# Patient Record
Sex: Male | Born: 1965 | Race: White | Hispanic: No | Marital: Single | Smoking: Never smoker
Health system: Southern US, Community
[De-identification: ages and names within clinical notes are randomized; demographics above are authoritative.]

## PROBLEM LIST (undated history)

## (undated) DIAGNOSIS — E785 Hyperlipidemia, unspecified: Secondary | ICD-10-CM

## (undated) DIAGNOSIS — I10 Essential (primary) hypertension: Secondary | ICD-10-CM

## (undated) HISTORY — DX: Essential (primary) hypertension: I10

## (undated) HISTORY — DX: Hyperlipidemia, unspecified: E78.5

---
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
## 2009-09-04 ENCOUNTER — Encounter: Admission: RE | Admit: 2009-09-04 | Discharge: 2009-09-04 | Payer: Self-pay | Admitting: Internal Medicine

## 2011-09-26 ENCOUNTER — Other Ambulatory Visit: Payer: Self-pay | Admitting: Internal Medicine

## 2011-09-29 ENCOUNTER — Ambulatory Visit
Admission: RE | Admit: 2011-09-29 | Discharge: 2011-09-29 | Disposition: A | Discharge status: Home / Self Care | Payer: PRIVATE HEALTH INSURANCE | Source: Ambulatory Visit | Attending: Internal Medicine | Admitting: Internal Medicine

## 2012-01-22 IMAGING — CT CT HEAD W/O CM
2 series · 15 of 30 positions shown, 19 images · non-contrast
Comparison: Report from previous MRI examination performed in
[HOSPITAL] 03/03/2003

CLINICAL DATA: History of pineal cyst.

CT HEAD WITHOUT CONTRAST
TECHNIQUE: Contiguous axial images were obtained from the base of
the skull through the vertex without contrast.

[Series 3: head bone · axial · 0.49mm/px · z∈[+23,+45]mm · 2 of 28 slices shown]
[im 2/28  bone]
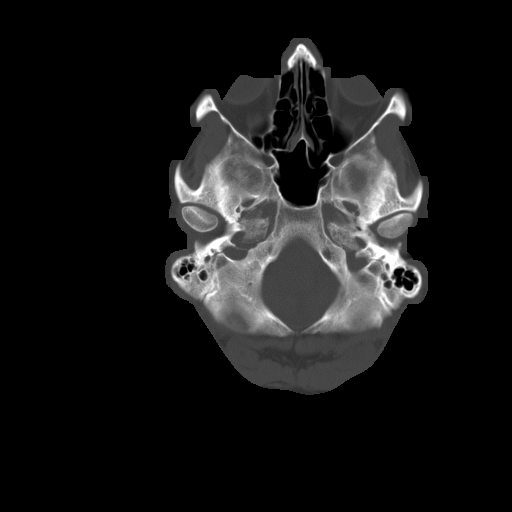
[im 6/28  bone]
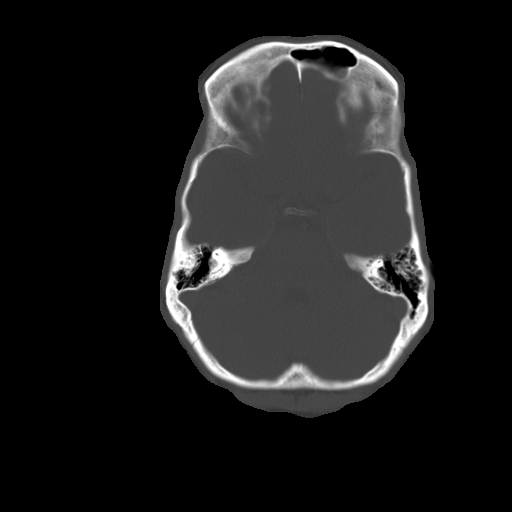

[Series 32: 3d filtered head w/o · axial · non-contrast · 0.49mm/px · z∈[+23,+154]mm · 13 of 28 slices shown, 17 images]
[im 2/28  brain]
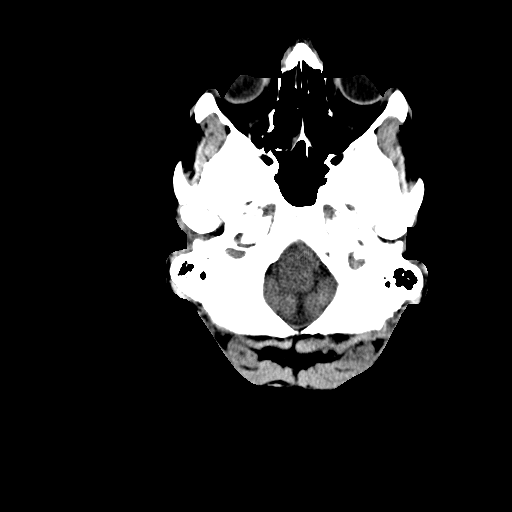
[im 2/28  bone]
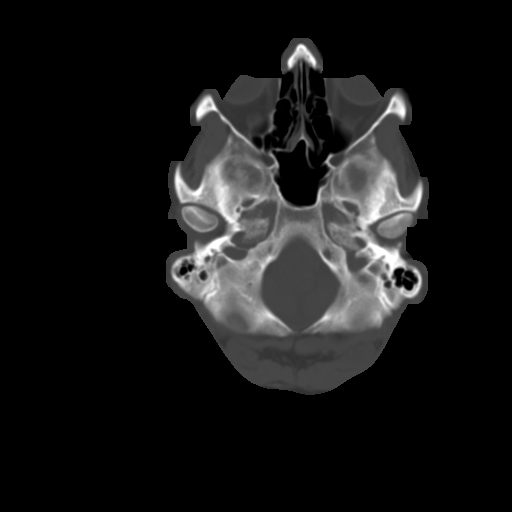
[im 4/28  brain]
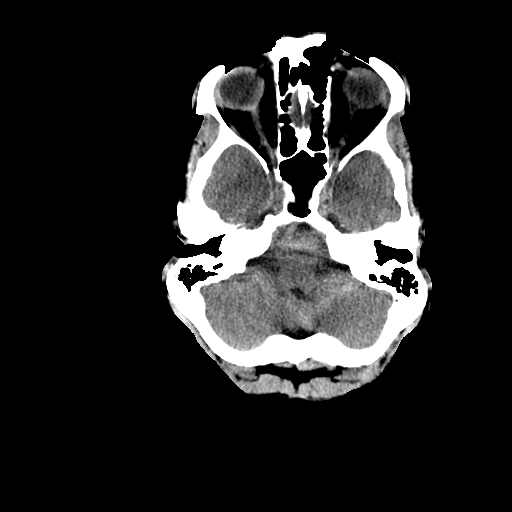
[im 6/28  brain]
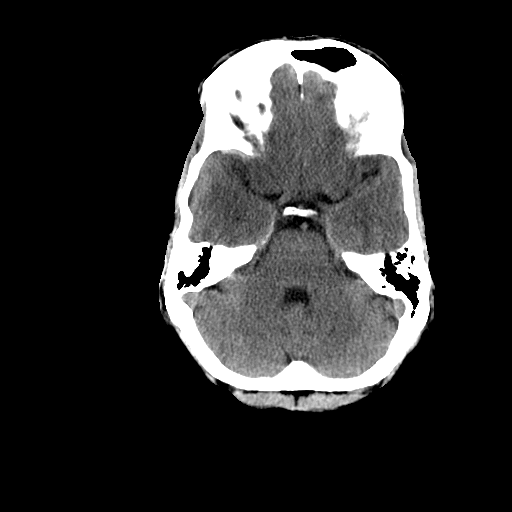
[im 8/28  brain]
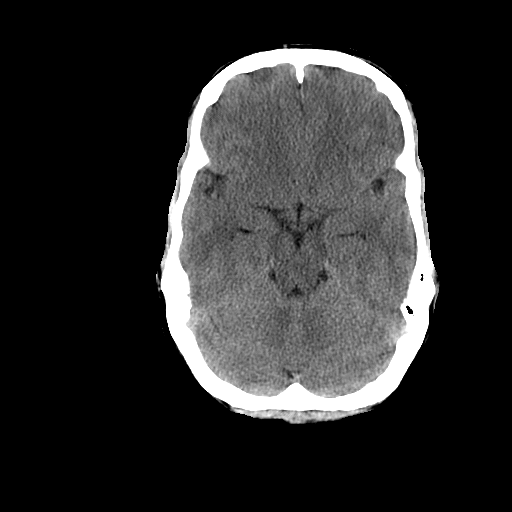
[im 10/28  brain]
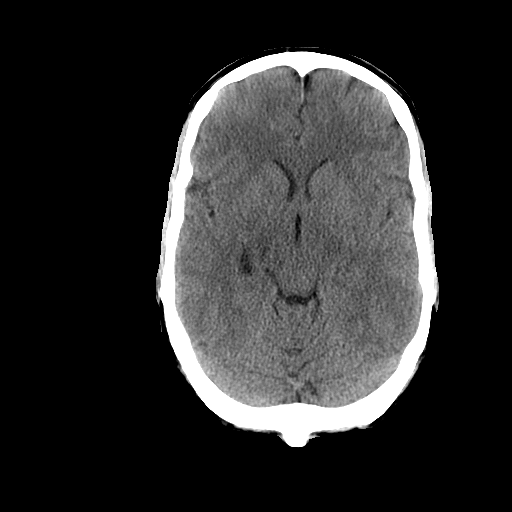
[im 10/28  bone]
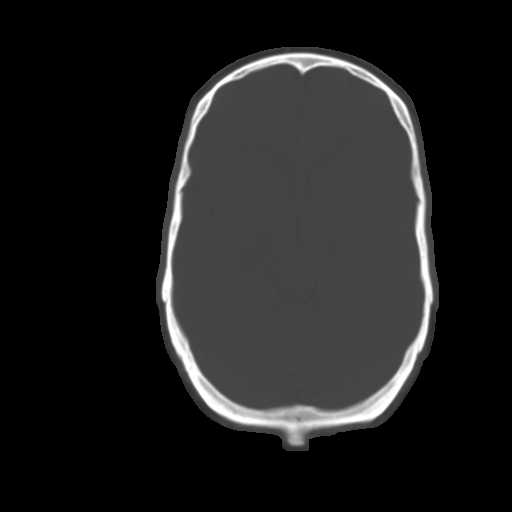
[im 12/28  brain]
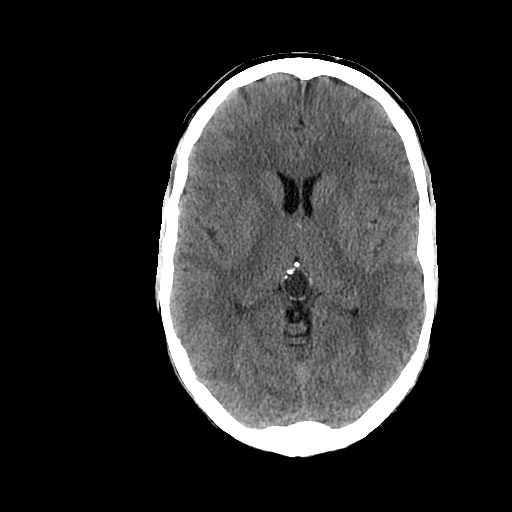
[im 14/28  brain]
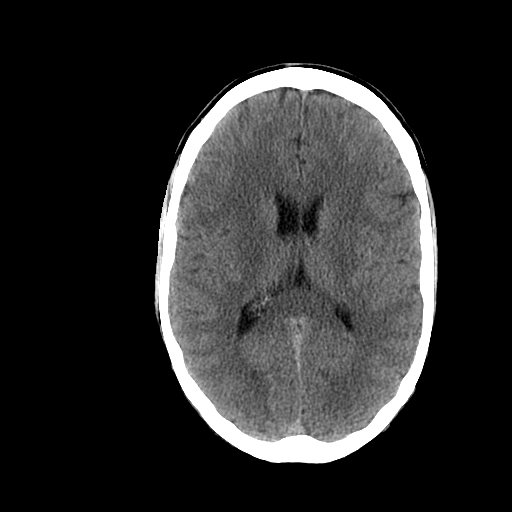
[im 16/28  brain]
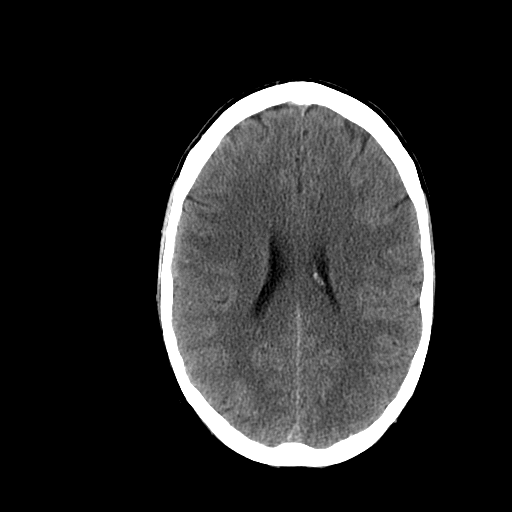
[im 18/28  brain]
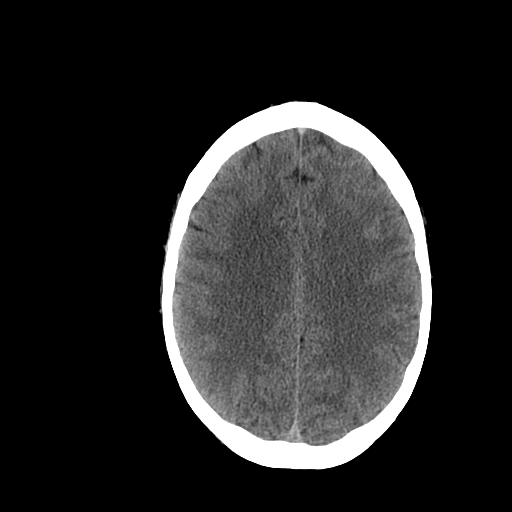
[im 18/28  bone]
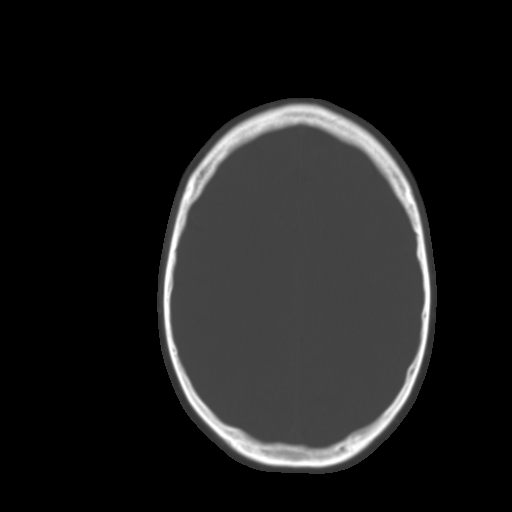
[im 20/28  brain]
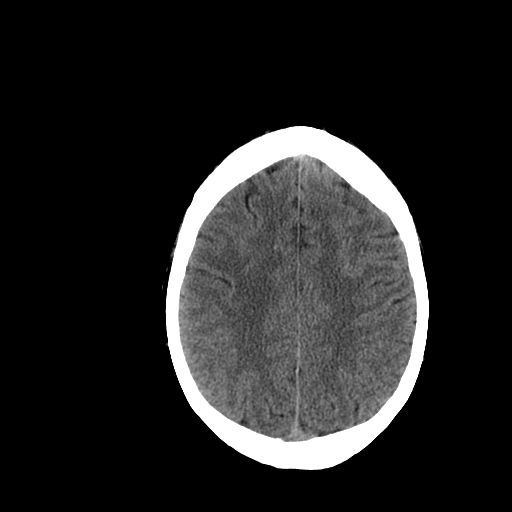
[im 22/28  brain]
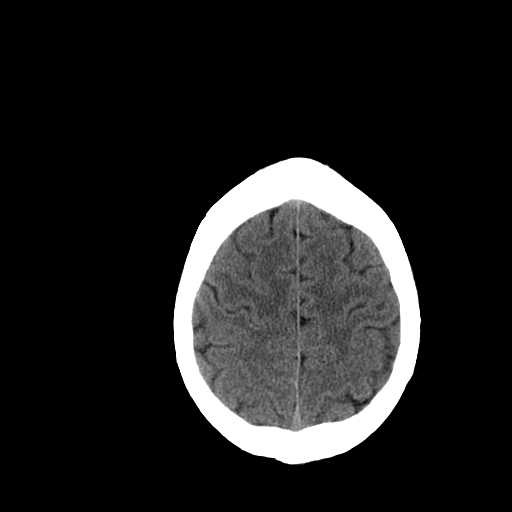
[im 24/28  brain]
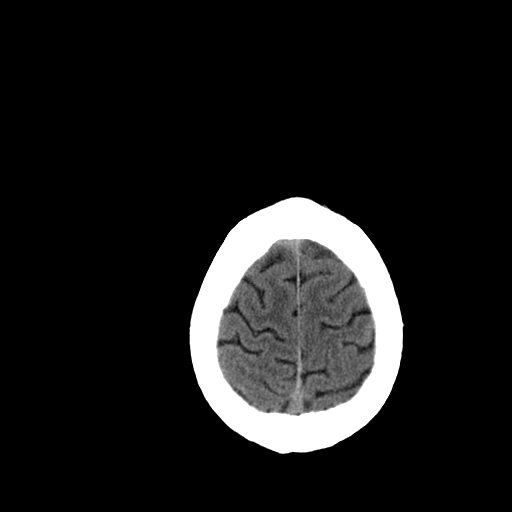
[im 26/28  brain]
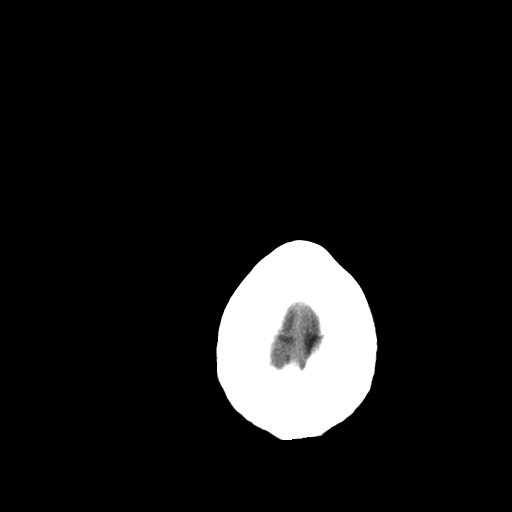
[im 26/28  bone]
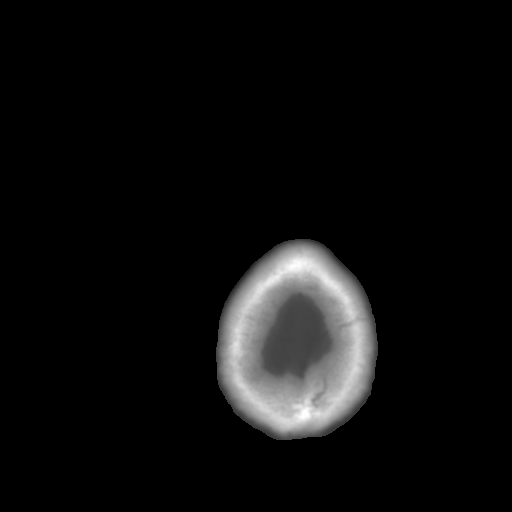

[15 of 30 positions shown; findings below may reference images not displayed]

FINDINGS: Previous report describes described a 1.4 x 0.7 x 1.2 cm
cyst in the region of the pineal gland and a right chorioid fissure
cyst measuring 1 x 1.7 x 0.6 cm.

There is a cystic lesion in the region of the pineal gland
measuring 16 mm front to back and 14 mm right to left.  There is
some peripheral calcification.  Cannot accurately measure
cephalocaudal dimension.  Because of the possibility of slight
enlargement, MRI with contrast would be suggested.  Low grade
pineal neoplasm is not excluded at this point.

Choroid fissure cyst on the right is noted as described previously.
This is not worrisome.

The remainder the brain has a normal appearance.  No evidence of
old or acute infarction, intra-axial mass lesion, hemorrhage,
hydrocephalus or extra-axial collection.  Sinuses, middle ears and
mastoids are clear.  No skull or skull base abnormality is seen.
IMPRESSION: Cystic lesion of the pineal region measuring 16 x 14 mm in the
axial plane, with some peripheral calcification.  The differential
diagnosis is pineal cyst versus cystic pineal neoplasm.  Because of
the potential for slight enlargement when compared to the dictated
measurements of the previous exam, MRI with contrast would be
suggested for better characterization. Since it has been over 6
years since the previous study, it is unlikely that this will be
significant, but MRI is the standard method to characterize these
lesions.

## 2016-05-30 DIAGNOSIS — B078 Other viral warts: Secondary | ICD-10-CM | POA: Diagnosis not present

## 2016-05-30 DIAGNOSIS — L570 Actinic keratosis: Secondary | ICD-10-CM | POA: Diagnosis not present

## 2016-05-30 DIAGNOSIS — D0461 Carcinoma in situ of skin of right upper limb, including shoulder: Secondary | ICD-10-CM | POA: Diagnosis not present

## 2016-05-30 DIAGNOSIS — D225 Melanocytic nevi of trunk: Secondary | ICD-10-CM | POA: Diagnosis not present

## 2016-05-30 DIAGNOSIS — X32XXXA Exposure to sunlight, initial encounter: Secondary | ICD-10-CM | POA: Diagnosis not present

## 2016-06-24 ENCOUNTER — Encounter (INDEPENDENT_AMBULATORY_CARE_PROVIDER_SITE_OTHER): Payer: Self-pay

## 2016-06-24 ENCOUNTER — Ambulatory Visit (INDEPENDENT_AMBULATORY_CARE_PROVIDER_SITE_OTHER): Payer: BLUE CROSS/BLUE SHIELD | Admitting: Sports Medicine

## 2016-06-24 ENCOUNTER — Encounter: Payer: Self-pay | Admitting: Sports Medicine

## 2016-06-24 DIAGNOSIS — S86119A Strain of other muscle(s) and tendon(s) of posterior muscle group at lower leg level, unspecified leg, initial encounter: Secondary | ICD-10-CM

## 2016-06-24 NOTE — Progress Notes (Signed)
    Subjective:  Joseph [name] is a 51 y.o. male who presents to the [facility] today with a chief complaint of leg cramps.   Referred for evaluation courtesy of Dr. Merri BrunetteWalter Pharr  HPI:  Leg pain Patient with episodic calf pain bilaterally (twice in left calf, once in right calf). First episode was about three months ago in his left calf that occurred about 10-15 mins into his usual run. The pain felt like a "tightness" that became so severe that he had to stop his run. This pain persisted for about 2 weeks after gradually subsiding. He had a similar episode of pain in his right calf a few weeks later about 10-15 minutes into his run that resolved after about 2 weeks, and again the same thing in his left calf about 2 months ago. Patient has not tried to run over the past 2 months and does not currently have any pain. Pain is not positional in nature. He did not notice anything that made the pain better or worse when it happened.   He has been a runner since high school Active in sports with no significant prior calf injuries  ROS: Denies low back pain Denies any circulatory issues Denies sciatica No swelling or discoloration in the calf  Objective:  Physical Exam: BP 120/70   Ht 5\' 10"  (1.778 m)   Wt 155 lb (70.3 kg)   BMI 22.24 kg/m   Gen: NAD, resting comfortably LLE: No deformities. Nontender to palpation throughout calf. Strength 5/5 in all fields. No pain with plantar or dorsiflexion. Mild loss of longitudinal arch. No leg length discrepancies.  RLE: No deformities. Nontender to palpation throughout calf. Strength 5/5 in all fields. No pain with plantar or dorsiflexion. Mild loss of longitudinal arch. No leg length discrepancies.   Running gait shows a supinated foot strike. He straps at the mid foot. There is mild rapid pronation at the mid foot.  Overall running form is good.  Assessment/Plan:  Gastrocnemius strain Symptoms consistent with recurrent gastrocnemius strain. Placed  patient in green insoles with heel lift today. Will treat conservatively with home exercise program including toe walk, reverse walk, heel walk and heel raises with straight leg and bent leg. Gave graduated program to return to running with increasing run-walk cycles. Return precautions reviewed. Follow up if not improving or worsening in 8 weeks.   Katina Degreealeb M. Jimmey RalphParker, MD [facility] Resident PGY-3 06/24/2016 3:27 PM

## 2016-06-24 NOTE — Assessment & Plan Note (Addendum)
Symptoms consistent with recurrent gastrocnemius strain. Placed patient in green insoles with heel lift today. Will treat conservatively with home exercise program including toe walk, reverse walk, heel walk and heel raises with straight leg and bent leg.   Gave graduated program to return to running with increasing run-walk cycles. Return precautions reviewed. Follow up if not improving or worsening in 8 weeks.   Since this is one of the most common issues of masters runners I suggested he follow the protocol we outlined. If he is unsuccessful we would move ahead to custom orthotics.

## 2016-07-03 DIAGNOSIS — N402 Nodular prostate without lower urinary tract symptoms: Secondary | ICD-10-CM | POA: Diagnosis not present

## 2016-07-04 DIAGNOSIS — E7800 Pure hypercholesterolemia, unspecified: Secondary | ICD-10-CM | POA: Diagnosis not present

## 2016-07-11 DIAGNOSIS — M79641 Pain in right hand: Secondary | ICD-10-CM | POA: Diagnosis not present

## 2016-08-13 DIAGNOSIS — K635 Polyp of colon: Secondary | ICD-10-CM | POA: Diagnosis not present

## 2016-08-13 DIAGNOSIS — Z1211 Encounter for screening for malignant neoplasm of colon: Secondary | ICD-10-CM | POA: Diagnosis not present

## 2016-08-13 DIAGNOSIS — K5730 Diverticulosis of large intestine without perforation or abscess without bleeding: Secondary | ICD-10-CM | POA: Diagnosis not present

## 2016-08-19 DIAGNOSIS — K635 Polyp of colon: Secondary | ICD-10-CM | POA: Diagnosis not present

## 2016-08-19 DIAGNOSIS — Z1211 Encounter for screening for malignant neoplasm of colon: Secondary | ICD-10-CM | POA: Diagnosis not present

## 2017-05-08 DIAGNOSIS — Z0000 Encounter for general adult medical examination without abnormal findings: Secondary | ICD-10-CM | POA: Diagnosis not present

## 2017-05-08 DIAGNOSIS — Z125 Encounter for screening for malignant neoplasm of prostate: Secondary | ICD-10-CM | POA: Diagnosis not present

## 2017-05-22 DIAGNOSIS — D72819 Decreased white blood cell count, unspecified: Secondary | ICD-10-CM | POA: Diagnosis not present

## 2017-05-22 DIAGNOSIS — Z0001 Encounter for general adult medical examination with abnormal findings: Secondary | ICD-10-CM | POA: Diagnosis not present

## 2017-05-22 DIAGNOSIS — E875 Hyperkalemia: Secondary | ICD-10-CM | POA: Diagnosis not present

## 2017-05-22 DIAGNOSIS — Z23 Encounter for immunization: Secondary | ICD-10-CM | POA: Diagnosis not present

## 2017-05-22 DIAGNOSIS — Z1212 Encounter for screening for malignant neoplasm of rectum: Secondary | ICD-10-CM | POA: Diagnosis not present

## 2017-06-19 DIAGNOSIS — Z08 Encounter for follow-up examination after completed treatment for malignant neoplasm: Secondary | ICD-10-CM | POA: Diagnosis not present

## 2017-06-19 DIAGNOSIS — L570 Actinic keratosis: Secondary | ICD-10-CM | POA: Diagnosis not present

## 2017-06-19 DIAGNOSIS — Z85828 Personal history of other malignant neoplasm of skin: Secondary | ICD-10-CM | POA: Diagnosis not present

## 2017-06-19 DIAGNOSIS — X32XXXD Exposure to sunlight, subsequent encounter: Secondary | ICD-10-CM | POA: Diagnosis not present

## 2018-05-28 DIAGNOSIS — Z125 Encounter for screening for malignant neoplasm of prostate: Secondary | ICD-10-CM | POA: Diagnosis not present

## 2018-05-28 DIAGNOSIS — E7800 Pure hypercholesterolemia, unspecified: Secondary | ICD-10-CM | POA: Diagnosis not present

## 2018-05-28 DIAGNOSIS — Z0000 Encounter for general adult medical examination without abnormal findings: Secondary | ICD-10-CM | POA: Diagnosis not present

## 2018-06-04 DIAGNOSIS — Z23 Encounter for immunization: Secondary | ICD-10-CM | POA: Diagnosis not present

## 2018-06-04 DIAGNOSIS — Z1212 Encounter for screening for malignant neoplasm of rectum: Secondary | ICD-10-CM | POA: Diagnosis not present

## 2018-06-04 DIAGNOSIS — Z8042 Family history of malignant neoplasm of prostate: Secondary | ICD-10-CM | POA: Diagnosis not present

## 2018-06-04 DIAGNOSIS — B070 Plantar wart: Secondary | ICD-10-CM | POA: Diagnosis not present

## 2018-06-04 DIAGNOSIS — Z8249 Family history of ischemic heart disease and other diseases of the circulatory system: Secondary | ICD-10-CM | POA: Diagnosis not present

## 2018-06-04 DIAGNOSIS — Z0001 Encounter for general adult medical examination with abnormal findings: Secondary | ICD-10-CM | POA: Diagnosis not present

## 2018-07-08 DIAGNOSIS — B070 Plantar wart: Secondary | ICD-10-CM | POA: Diagnosis not present

## 2018-07-08 DIAGNOSIS — X32XXXD Exposure to sunlight, subsequent encounter: Secondary | ICD-10-CM | POA: Diagnosis not present

## 2018-07-08 DIAGNOSIS — Z08 Encounter for follow-up examination after completed treatment for malignant neoplasm: Secondary | ICD-10-CM | POA: Diagnosis not present

## 2018-07-08 DIAGNOSIS — L570 Actinic keratosis: Secondary | ICD-10-CM | POA: Diagnosis not present

## 2018-07-08 DIAGNOSIS — Z85828 Personal history of other malignant neoplasm of skin: Secondary | ICD-10-CM | POA: Diagnosis not present

## 2018-07-08 DIAGNOSIS — B078 Other viral warts: Secondary | ICD-10-CM | POA: Diagnosis not present

## 2019-01-14 DIAGNOSIS — X32XXXD Exposure to sunlight, subsequent encounter: Secondary | ICD-10-CM | POA: Diagnosis not present

## 2019-01-14 DIAGNOSIS — L570 Actinic keratosis: Secondary | ICD-10-CM | POA: Diagnosis not present

## 2019-01-14 DIAGNOSIS — B070 Plantar wart: Secondary | ICD-10-CM | POA: Diagnosis not present

## 2019-03-07 DIAGNOSIS — Z23 Encounter for immunization: Secondary | ICD-10-CM | POA: Diagnosis not present

## 2019-06-03 DIAGNOSIS — E7800 Pure hypercholesterolemia, unspecified: Secondary | ICD-10-CM | POA: Diagnosis not present

## 2019-06-03 DIAGNOSIS — N390 Urinary tract infection, site not specified: Secondary | ICD-10-CM | POA: Diagnosis not present

## 2019-06-03 DIAGNOSIS — Z0000 Encounter for general adult medical examination without abnormal findings: Secondary | ICD-10-CM | POA: Diagnosis not present

## 2019-06-03 DIAGNOSIS — Z125 Encounter for screening for malignant neoplasm of prostate: Secondary | ICD-10-CM | POA: Diagnosis not present

## 2019-06-10 DIAGNOSIS — Z1212 Encounter for screening for malignant neoplasm of rectum: Secondary | ICD-10-CM | POA: Diagnosis not present

## 2019-06-10 DIAGNOSIS — G500 Trigeminal neuralgia: Secondary | ICD-10-CM | POA: Diagnosis not present

## 2019-06-10 DIAGNOSIS — Z872 Personal history of diseases of the skin and subcutaneous tissue: Secondary | ICD-10-CM | POA: Diagnosis not present

## 2019-06-10 DIAGNOSIS — Z0001 Encounter for general adult medical examination with abnormal findings: Secondary | ICD-10-CM | POA: Diagnosis not present

## 2019-06-10 DIAGNOSIS — B070 Plantar wart: Secondary | ICD-10-CM | POA: Diagnosis not present

## 2019-08-04 DIAGNOSIS — Z08 Encounter for follow-up examination after completed treatment for malignant neoplasm: Secondary | ICD-10-CM | POA: Diagnosis not present

## 2019-08-04 DIAGNOSIS — X32XXXD Exposure to sunlight, subsequent encounter: Secondary | ICD-10-CM | POA: Diagnosis not present

## 2019-08-04 DIAGNOSIS — Z85828 Personal history of other malignant neoplasm of skin: Secondary | ICD-10-CM | POA: Diagnosis not present

## 2019-08-04 DIAGNOSIS — L570 Actinic keratosis: Secondary | ICD-10-CM | POA: Diagnosis not present

## 2019-08-04 DIAGNOSIS — B078 Other viral warts: Secondary | ICD-10-CM | POA: Diagnosis not present

## 2019-11-12 DIAGNOSIS — E162 Hypoglycemia, unspecified: Secondary | ICD-10-CM | POA: Diagnosis not present

## 2019-11-12 DIAGNOSIS — R4020 Unspecified coma: Secondary | ICD-10-CM | POA: Diagnosis not present

## 2019-11-12 DIAGNOSIS — E161 Other hypoglycemia: Secondary | ICD-10-CM | POA: Diagnosis not present

## 2019-11-12 DIAGNOSIS — R55 Syncope and collapse: Secondary | ICD-10-CM | POA: Diagnosis not present

## 2019-11-16 DIAGNOSIS — R55 Syncope and collapse: Secondary | ICD-10-CM | POA: Diagnosis not present

## 2019-11-16 DIAGNOSIS — I10 Essential (primary) hypertension: Secondary | ICD-10-CM | POA: Diagnosis not present

## 2019-12-05 DIAGNOSIS — Z20822 Contact with and (suspected) exposure to covid-19: Secondary | ICD-10-CM | POA: Diagnosis not present

## 2019-12-05 DIAGNOSIS — Z03818 Encounter for observation for suspected exposure to other biological agents ruled out: Secondary | ICD-10-CM | POA: Diagnosis not present

## 2020-04-04 DIAGNOSIS — Z23 Encounter for immunization: Secondary | ICD-10-CM | POA: Diagnosis not present

## 2020-04-11 DIAGNOSIS — L570 Actinic keratosis: Secondary | ICD-10-CM | POA: Diagnosis not present

## 2020-04-11 DIAGNOSIS — X32XXXD Exposure to sunlight, subsequent encounter: Secondary | ICD-10-CM | POA: Diagnosis not present

## 2020-07-01 DIAGNOSIS — Z1152 Encounter for screening for COVID-19: Secondary | ICD-10-CM | POA: Diagnosis not present

## 2020-07-03 DIAGNOSIS — Z0000 Encounter for general adult medical examination without abnormal findings: Secondary | ICD-10-CM | POA: Diagnosis not present

## 2020-07-03 DIAGNOSIS — Z125 Encounter for screening for malignant neoplasm of prostate: Secondary | ICD-10-CM | POA: Diagnosis not present

## 2020-07-03 DIAGNOSIS — E7800 Pure hypercholesterolemia, unspecified: Secondary | ICD-10-CM | POA: Diagnosis not present

## 2020-07-06 DIAGNOSIS — E7800 Pure hypercholesterolemia, unspecified: Secondary | ICD-10-CM | POA: Diagnosis not present

## 2020-07-06 DIAGNOSIS — Z8249 Family history of ischemic heart disease and other diseases of the circulatory system: Secondary | ICD-10-CM | POA: Diagnosis not present

## 2020-07-06 DIAGNOSIS — Z1212 Encounter for screening for malignant neoplasm of rectum: Secondary | ICD-10-CM | POA: Diagnosis not present

## 2020-07-06 DIAGNOSIS — E875 Hyperkalemia: Secondary | ICD-10-CM | POA: Diagnosis not present

## 2020-07-06 DIAGNOSIS — N4289 Other specified disorders of prostate: Secondary | ICD-10-CM | POA: Diagnosis not present

## 2020-07-06 DIAGNOSIS — G5603 Carpal tunnel syndrome, bilateral upper limbs: Secondary | ICD-10-CM | POA: Diagnosis not present

## 2020-07-06 DIAGNOSIS — Z0001 Encounter for general adult medical examination with abnormal findings: Secondary | ICD-10-CM | POA: Diagnosis not present

## 2020-07-06 DIAGNOSIS — T148XXA Other injury of unspecified body region, initial encounter: Secondary | ICD-10-CM | POA: Diagnosis not present

## 2020-07-24 DIAGNOSIS — I10 Essential (primary) hypertension: Secondary | ICD-10-CM | POA: Diagnosis not present

## 2020-08-24 DIAGNOSIS — I10 Essential (primary) hypertension: Secondary | ICD-10-CM | POA: Diagnosis not present

## 2020-08-29 DIAGNOSIS — I2510 Atherosclerotic heart disease of native coronary artery without angina pectoris: Secondary | ICD-10-CM | POA: Diagnosis not present

## 2020-08-29 DIAGNOSIS — I10 Essential (primary) hypertension: Secondary | ICD-10-CM | POA: Diagnosis not present

## 2020-08-29 DIAGNOSIS — I2584 Coronary atherosclerosis due to calcified coronary lesion: Secondary | ICD-10-CM | POA: Diagnosis not present

## 2020-10-17 ENCOUNTER — Encounter: Payer: Self-pay | Admitting: Cardiology

## 2020-10-17 ENCOUNTER — Other Ambulatory Visit: Payer: Self-pay

## 2020-10-17 ENCOUNTER — Ambulatory Visit: Payer: BC Managed Care – PPO | Admitting: Cardiology

## 2020-10-17 VITALS — BP 136/83 | HR 51 | Temp 97.8°F | Resp 17 | Ht 70.0 in | Wt 168.2 lb

## 2020-10-17 DIAGNOSIS — I2510 Atherosclerotic heart disease of native coronary artery without angina pectoris: Secondary | ICD-10-CM

## 2020-10-17 DIAGNOSIS — Z8249 Family history of ischemic heart disease and other diseases of the circulatory system: Secondary | ICD-10-CM

## 2020-10-17 DIAGNOSIS — E7800 Pure hypercholesterolemia, unspecified: Secondary | ICD-10-CM

## 2020-10-17 DIAGNOSIS — R931 Abnormal findings on diagnostic imaging of heart and coronary circulation: Secondary | ICD-10-CM | POA: Diagnosis not present

## 2020-10-17 MED ORDER — ATORVASTATIN CALCIUM 40 MG PO TABS: 40.0000 mg | ORAL_TABLET | Freq: Every evening | ORAL | 0 refills | Status: AC

## 2020-10-17 NOTE — Progress Notes (Signed)
Primary Physician/Referring:  Deland Pretty, MD  Patient ID: Joseph [name], male    DOB: Apr 02, 1966, 55 y.o.   MRN: [id number]  Chief Complaint  Patient presents with  . New Patient (Initial Visit)    Ref by Deland Pretty, MD  . Coronary Artery Disease   HPI:    Joseph [name]  is a 55 y.o. Caucasian male with a very strong family history of premature coronary disease, father had myocardial infarction at the age of 56 years of age, hypertension, hyperlipidemia referred to me for cardiac restratification after he underwent coronary calcium scoring. He is asymptomatic.  History reviewed. No pertinent past medical history. Past Surgical History:  Procedure Laterality Date  . HERNIA REPAIR  1969   Family History  Problem Relation Age of Onset  . Hypertension Mother   . Cancer Father   . High blood pressure Father   . Hypertension Father   . Heart attack Father 16       MI  . Hypertension Sister   . High Cholesterol Brother     Social History   Tobacco Use  . Smoking status: Never Smoker  . Smokeless tobacco: Never Used  Substance Use Topics  . Alcohol use: Yes    Alcohol/week: 10.0 standard drinks    Types: 5 Glasses of wine, 5 Cans of beer per week    Comment: OCC   Marital Status: Single  ROS  Review of Systems  Cardiovascular: Negative for chest pain, dyspnea on exertion and leg swelling.  Gastrointestinal: Negative for melena.   Objective  Blood pressure 136/83, pulse (!) 51, temperature 97.8 F (36.6 C), temperature source Temporal, resp. rate 17, height 5' 10"  (1.778 m), weight 168 lb 3.2 oz (76.3 kg), SpO2 97 %.   Physical Exam  Constitutional: No distress.  Eyes: Conjunctivae are normal.  Neck: No JVD present.  Cardiovascular: Regular rhythm, normal heart sounds, intact distal pulses and normal pulses. Exam reveals no gallop.  No murmur heard. Pulmonary/Chest: Effort normal and breath sounds normal. He exhibits no tenderness.  Abdominal: Soft. Bowel  sounds are normal.  Musculoskeletal:        General: No edema. Normal range of motion.     Cervical back: Neck supple.  Neurological: He is alert and oriented to person, place, and time.  Skin: Skin is warm.     Laboratory examination:   External labs:   Labs 08/24/2020:  Serum glucose 97 mg, sodium 142, potassium 4.7, BUN 14, creatinine 1.2, EGFR >60 mL, CMP otherwise normal.  Hb 14.1/HCT 42.6, platelets 200.  Labs 07/03/2020:   Total cholesterol 191, triglycerides 82, HDL 69, LDL 106. LDL particle #956, LP-IR score 45.  Medications and allergies  No Known Allergies   Current Outpatient Medications on File Prior to Visit  Medication Sig Dispense Refill  . losartan (COZAAR) 50 MG tablet Take 1 tablet by mouth every evening.     No current facility-administered medications on file prior to visit.    Radiology:   No results found.  Cardiac Studies:   Coronary calcium score 07/12/2020: TOTAL CALCIUM SCORE :  63  LEFT MAIN:0  LAD:63  CIRCUMFLEX: 0  RCA: 0  The total coronary calcium score is 63 which is between the 50th and 75th percentile for a male patient this age.   VISUALIZED THORAX: Small hiatal hernia.  EKG:     EKG 10/17/2020: Normal sinus rhythm/sinus bradycardia at rate of 50 bpm, otherwise normal EKG.  Assessment     ICD-10-CM   1. Coronary artery calcification seen on CAT scan  I25.10 EKG 12-Lead  2. Agatston CAC score, <100  R93.1   3. Hypercholesteremia  E78.00 atorvastatin (LIPITOR) 40 MG tablet  4. Family history of premature CAD. Father with CAD at age 30 Y  Z71.49     Patient's cardiovascular risk history includes: LDL goal is under 100 Framingham 10-year risk <10%. (5.2%)   Medications Discontinued During This Encounter  Medication Reason  . ASPIRIN 81 PO Discontinued by provider  . atorvastatin (LIPITOR) 20 MG tablet Dose change    Meds ordered this encounter  Medications  . atorvastatin (LIPITOR) 40 MG tablet    Sig: Take  1 tablet (40 mg total) by mouth every evening.    Dispense:  90 tablet    Refill:  0   Orders Placed This Encounter  Procedures  . EKG 12-Lead   Recommendations:   Joseph [name] is a 55 y.o. Caucasian male with a very strong family history of premature coronary disease, father had myocardial infarction at the age of 56 years of age, hypertension, hyperlipidemia referred to me for cardiac restratification after he underwent coronary calcium scoring.  Patient's records were reviewed, his exercise program was reviewed, patient has been able to exercise fairly intense exercise without any chest pain or dyspnea.  Physical examination is completely normal with normal vascular exam and a normal EKG.  Recommendations for primary prevention include increasing atorvastatin to 40 mg as his LDL is >100.  We will change his losartan to evening dose as he has noticed his blood pressure still is high in the evenings at least 3-4 times out of 10 times when he evaluates but otherwise fairly well controlled.  I have discontinued aspirin as this CV risk is <10%.  Otherwise he is active, ideal body weight, no tests were recommended today and I will see him back on a as needed basis.    Adrian Prows, MD, [facility] 10/17/2020, 11:11 AM Office: [phone]

## 2021-01-19 DIAGNOSIS — Z03818 Encounter for observation for suspected exposure to other biological agents ruled out: Secondary | ICD-10-CM | POA: Diagnosis not present

## 2021-05-24 DIAGNOSIS — X32XXXD Exposure to sunlight, subsequent encounter: Secondary | ICD-10-CM | POA: Diagnosis not present

## 2021-05-24 DIAGNOSIS — L570 Actinic keratosis: Secondary | ICD-10-CM | POA: Diagnosis not present

## 2021-07-05 DIAGNOSIS — Z125 Encounter for screening for malignant neoplasm of prostate: Secondary | ICD-10-CM | POA: Diagnosis not present

## 2021-07-05 DIAGNOSIS — Z0000 Encounter for general adult medical examination without abnormal findings: Secondary | ICD-10-CM | POA: Diagnosis not present

## 2021-07-10 DIAGNOSIS — Z0001 Encounter for general adult medical examination with abnormal findings: Secondary | ICD-10-CM | POA: Diagnosis not present

## 2021-07-10 DIAGNOSIS — I2510 Atherosclerotic heart disease of native coronary artery without angina pectoris: Secondary | ICD-10-CM | POA: Diagnosis not present

## 2021-07-10 DIAGNOSIS — Z23 Encounter for immunization: Secondary | ICD-10-CM | POA: Diagnosis not present

## 2021-07-10 DIAGNOSIS — Z1212 Encounter for screening for malignant neoplasm of rectum: Secondary | ICD-10-CM | POA: Diagnosis not present

## 2021-07-10 DIAGNOSIS — D72819 Decreased white blood cell count, unspecified: Secondary | ICD-10-CM | POA: Diagnosis not present

## 2021-07-10 DIAGNOSIS — I10 Essential (primary) hypertension: Secondary | ICD-10-CM | POA: Diagnosis not present

## 2021-07-17 ENCOUNTER — Other Ambulatory Visit: Payer: Self-pay | Admitting: Internal Medicine

## 2021-07-17 DIAGNOSIS — Z0001 Encounter for general adult medical examination with abnormal findings: Secondary | ICD-10-CM

## 2021-07-22 ENCOUNTER — Ambulatory Visit
Admission: RE | Admit: 2021-07-22 | Discharge: 2021-07-22 | Disposition: A | Discharge status: Home / Self Care | Payer: BC Managed Care – PPO | Source: Ambulatory Visit | Attending: Internal Medicine | Admitting: Internal Medicine

## 2021-07-22 DIAGNOSIS — E785 Hyperlipidemia, unspecified: Secondary | ICD-10-CM | POA: Diagnosis not present

## 2021-07-22 DIAGNOSIS — Z0001 Encounter for general adult medical examination with abnormal findings: Secondary | ICD-10-CM

## 2021-07-31 ENCOUNTER — Ambulatory Visit: Payer: BC Managed Care – PPO

## 2021-07-31 ENCOUNTER — Other Ambulatory Visit: Payer: Self-pay

## 2021-07-31 ENCOUNTER — Ambulatory Visit: Payer: BC Managed Care – PPO | Admitting: Cardiology

## 2021-07-31 ENCOUNTER — Encounter: Payer: Self-pay | Admitting: Cardiology

## 2021-07-31 VITALS — BP 125/80 | HR 63 | Temp 98.0°F | Resp 17 | Ht 70.0 in | Wt 173.6 lb

## 2021-07-31 DIAGNOSIS — Z8249 Family history of ischemic heart disease and other diseases of the circulatory system: Secondary | ICD-10-CM

## 2021-07-31 DIAGNOSIS — R079 Chest pain, unspecified: Secondary | ICD-10-CM

## 2021-07-31 DIAGNOSIS — I10 Essential (primary) hypertension: Secondary | ICD-10-CM | POA: Diagnosis not present

## 2021-07-31 DIAGNOSIS — E7800 Pure hypercholesterolemia, unspecified: Secondary | ICD-10-CM

## 2021-07-31 NOTE — Addendum Note (Signed)
Addended by: Kela Millin on: 07/31/2021 09:40 PM   Modules accepted: Level of Service

## 2021-07-31 NOTE — Progress Notes (Addendum)
° °Primary Physician/Referring:  Pharr, Walter, MD ° °Patient ID: Joseph [name], male    DOB: 12/27/1965, 56 y.o.   MRN: [id number] ° °Chief Complaint  °Patient presents with  ° Chest Pain  ° °HPI:   ° °Joseph [name]  is a 56 y.o. Caucasian male with a very strong family history of premature coronary disease, father had myocardial infarction at the age of 57 years of age, hypertension, hyperlipidemia, elevated coronary calcium score around 70th percentile in Jan 2022.  I last seen him almost a year ago and he was asymptomatic and documented primary prevention with control of hypertension and hyperlipidemia as he is physically very active. ° °He is now referred back to me for chest pain evaluation.  Described as sharp pains lasting few seconds in the left upper side of the chest.  Unrelated to exertion.  Otherwise remains asymptomatic and continues to exercise regularly and runs at least for 35 minutes at 10 miles an hour speed. ° °History reviewed. No pertinent past medical history. ° °Past Surgical History:  °Procedure Laterality Date  ° HERNIA REPAIR  1969  ° °Family History  °Problem Relation Age of Onset  ° Hypertension Mother   ° Cancer Father   ° High blood pressure Father   ° Hypertension Father   ° Heart attack Father 46  °     MI  ° High Cholesterol Brother   ° High Cholesterol Brother   ° Hypertension Brother   ° High Cholesterol Brother   °  °Social History  ° °Tobacco Use  ° Smoking status: Never  ° Smokeless tobacco: Never  °Substance Use Topics  ° Alcohol use: Yes  °  Alcohol/week: 10.0 standard drinks  °  Types: 5 Glasses of wine, 5 Cans of beer per week  °  Comment: OCC  ° °Marital Status: Single  °ROS  ° °Review of Systems  °Cardiovascular:  Negative for chest pain, dyspnea on exertion and leg swelling.  °Gastrointestinal:  Negative for melena.  ° °Objective  ° °Blood pressure 125/80, pulse 63, temperature 98 °F (36.7 °C), temperature source Temporal, resp. rate 17, height 5' 10" (1.778 m),  weight 173 lb 9.6 oz (78.7 kg), SpO2 96 %.  ° °Vitals with BMI 07/31/2021 10/17/2020 06/24/2016  °Height 5' 10" 5' 10" 5' 10"  °Weight 173 lbs 10 oz 168 lbs 3 oz 155 lbs  °BMI 24.91 24.13 22.3  °Systolic 125 136 120  °Diastolic 80 83 70  °Pulse 63 51 -  ° °Physical Exam  °Neck: No JVD present.  °Cardiovascular: Regular rhythm, normal heart sounds, intact distal pulses and normal pulses. Exam reveals no gallop.  °No murmur heard. °Pulmonary/Chest: Effort normal and breath sounds normal. He exhibits no tenderness.  °Abdominal: Soft. Bowel sounds are normal.  °Musculoskeletal:     °   General: No edema.   ° °Laboratory examination:  ° °External labs:  ° °Labs 07/05/2021: ° °Total cholesterol 150, triglycerides 87, HDL 49, LDL 84.  Non-HDL cholesterol 101. ° °Hb 13.7/HCT 41.7, platelets 219, normal indicis. ° °Potassium 4.3, BUN 13, creatinine 1.18, EGFR 68 mL, serum is 99.  LFTs normal. ° °Labs 07/03/2020:  ° °Total cholesterol 191, triglycerides 82, HDL 69, LDL 106. °LDL particle #956, LP-IR score 45. ° °Medications and allergies  °No Known Allergies  ° °Current Outpatient Medications on File Prior to Visit  °Medication Sig Dispense Refill  ° atorvastatin (LIPITOR) 40 MG tablet Take 1 tablet (40 mg total) by mouth every evening.   evening. 90 tablet 0   losartan (COZAAR) 50 MG tablet Take 1 tablet by mouth every evening.     No current facility-administered medications on file prior to visit.    Radiology:     Cardiac Studies:   Coronary calcium score 07/12/2020: TOTAL CALCIUM SCORE :   67  LEFT MAIN:0  LAD:63  CIRCUMFLEX: 0  RCA: 0  The total coronary calcium score is 63 which is between the 50th and 75th percentile for a male patient this age.   VISUALIZED THORAX: Small hiatal hernia.  ABI 07/22/2021: Right ABI 1.2, left ABI 1.2.  Normal waveforms at the ankle.  EKG:   EKG 07/31/2021: Sinus bradycardia at rate of 51 bpm, otherwise normal EKG.   Assessment     ICD-10-CM   1. Chest pain of uncertain etiology   A35.5 EKG 12-Lead    2. Primary hypertension  I10     3. Hypercholesteremia  E78.00     4. Family history of premature CAD. Father with CAD at age 5 Y  Z12.49       Patient's cardiovascular risk history includes: LDL goal is under 100 Framingham 10-year risk <10%. (5.2%)   There are no discontinued medications.   No orders of the defined types were placed in this encounter.  Orders Placed This Encounter  Procedures   EKG 12-Lead   Recommendations:   Joseph [name] is a 56 y.o. Caucasian male with a very strong family history of premature coronary disease, father had myocardial infarction at the age of 57 years of age, hypertension, hyperlipidemia, elevated coronary calcium score around 70th percentile in Jan 2022.  I last seen him almost a year ago and he was asymptomatic and documented primary prevention with control of hypertension and hyperlipidemia as he is physically very active.  He is now referred back to me for chest pain evaluation.  Described as sharp pains lasting few seconds in the left upper side of the chest.  Unrelated to exertion.  I do not think that this is high risk chest pain, we will schedule him for a routine treadmill exercise stress test and an echocardiogram.  I reviewed his eczema labs, lipids are at goal.  I had on his prior visit increased his Lipitor from 20 to 40 mg which he is tolerating.  I discussed with him again regarding primary prevention in patients with high risk for CAD especially with mildly elevated coronary calcium score for his age and also strong family history of premature coronary disease.  Weight loss was also discussed with the patient.  I reassured him. I will personally perform the test and if I find abnormalities,  will perform further evaluation. Otherwise unless new on ongoing symptoms(patient advised to contact us), preventive  therapy is recommended. I will then see the patient on a PRN basis.  I spent 50 minutes on his  evaluation of his medical records, labs, reevaluation of his coronary calcium score and discussions regarding primary prevention.   Adrian Prows, MD, [address] 07/31/2021, 10:35 AM Office: [phone]

## 2021-08-03 NOTE — Progress Notes (Signed)
Normal echo.

## 2021-08-20 ENCOUNTER — Other Ambulatory Visit: Payer: Self-pay

## 2021-08-20 ENCOUNTER — Ambulatory Visit: Payer: BC Managed Care – PPO

## 2021-08-20 DIAGNOSIS — R079 Chest pain, unspecified: Secondary | ICD-10-CM

## 2021-09-10 DIAGNOSIS — H40033 Anatomical narrow angle, bilateral: Secondary | ICD-10-CM | POA: Diagnosis not present

## 2022-02-17 DIAGNOSIS — M720 Palmar fascial fibromatosis [Dupuytren]: Secondary | ICD-10-CM | POA: Diagnosis not present

## 2022-03-24 DIAGNOSIS — M720 Palmar fascial fibromatosis [Dupuytren]: Secondary | ICD-10-CM | POA: Diagnosis not present

## 2022-03-26 DIAGNOSIS — M25641 Stiffness of right hand, not elsewhere classified: Secondary | ICD-10-CM | POA: Diagnosis not present

## 2022-03-26 DIAGNOSIS — M720 Palmar fascial fibromatosis [Dupuytren]: Secondary | ICD-10-CM | POA: Diagnosis not present

## 2022-04-28 DIAGNOSIS — M79641 Pain in right hand: Secondary | ICD-10-CM | POA: Diagnosis not present

## 2022-04-28 DIAGNOSIS — M720 Palmar fascial fibromatosis [Dupuytren]: Secondary | ICD-10-CM | POA: Diagnosis not present

## 2022-05-05 DIAGNOSIS — X32XXXD Exposure to sunlight, subsequent encounter: Secondary | ICD-10-CM | POA: Diagnosis not present

## 2022-05-05 DIAGNOSIS — L570 Actinic keratosis: Secondary | ICD-10-CM | POA: Diagnosis not present

## 2023-08-20 ENCOUNTER — Telehealth (HOSPITAL_BASED_OUTPATIENT_CLINIC_OR_DEPARTMENT_OTHER): Payer: Self-pay | Admitting: Nurse Practitioner

## 2023-08-20 NOTE — Telephone Encounter (Signed)
 Pt stopped by [facility] front desk to let us know that he was admitted overnight at [facility] in [facility] 07/26/23-07/27/23 and he wants [place] to get his records from that stay in preparation for his appt on 3/10 with Robin Searing. I printed out a release form, had pt fill it out, and faxed to [facility]. I listed [facility] fax number and address as the receiving facility and will f/u with [facility] to make sure that they got the fax. Patient would like to be notified when the records are received.

## 2023-08-21 NOTE — Telephone Encounter (Signed)
 [facility] records came in today. I will index to chart and notify [facility].

## 2023-08-21 NOTE — Telephone Encounter (Signed)
 Attempted phone call to pt and left voicemail to contact office at [phone].

## 2023-08-25 NOTE — Telephone Encounter (Signed)
 Pt notified that records were received and scanned into his chart. Patient appreciates the update/call.

## 2023-08-30 NOTE — Progress Notes (Unsigned)
 Cardiology Office Note    Patient Name: Joseph Joseph [name] Date of Encounter: 08/30/2023  Primary Care Provider:  Merri Brunette, MD Primary Cardiologist:  None Primary Electrophysiologist: None   Past Medical History    Past Medical History:  Diagnosis Date   Hyperlipidemia    Hypertension     History of Present Illness  Joseph Joseph [name] is a 58 y.o. male with a PMH of premature CAD, HTN, HLD presents today for post hospital follow-up of chest pain.  Mr. Joseph Joseph [name] was seen initially by Dr. Jacinto Joseph [name] on 10/17/2020 family history of premature CAD.  Coronary calcium score on 06/2020 that was elevated and was started on statin and aspirin.  He was last seen by Dr. Jacinto Joseph [name] on 07/31/2021 with complaint of chest pain.  He went a 2D echo at that time that showed EF of 66% with Joseph [name] RWMA and trace MR.  He also underwent an exercise treadmill stress test that was low risk and normal.  He presented to the ED at [facility] in [place] on 07/26/2023 with complaint of lightheadedness and chest tightness.  He reported intermittent chest pain over the past 1-1/2 weeks.  He underwent ACS workup that was negative and EKG showed sinus rhythm.  He underwent a nuclear exercise stress test that was negative and ruled out MI.  He was seen by his PCP on 08/13/2023 with Joseph [name] further chest pain noted and Joseph [name] new medication changes made at that time.  Mr. Joseph Joseph [name] presents today for posthospital follow-up.  During today's visit he reports ongoing chest discomfort that he describes as waxing and waning and located in the lateral pectoral regions bilaterally.  He also reports occasional discomfort down his left shoulder and arm.  EKG was completed today showing Joseph [name] evidence of ACS and patient's blood pressure was stable at 124/80.  He denies any discomfort with activity and was at the gym recently without any signs or symptoms.  He has been compliant with his current medications and denies any adverse reactions.  During  today's visit we discussed the pathophysiology of coronary disease and also reviewed his stress test and previous calcium score results. Patient denies palpitations, dyspnea, PND, orthopnea, nausea, vomiting, dizziness, syncope, edema, weight gain, or early satiety.   Review of Systems  Please see the history of present illness.    All other systems reviewed and are otherwise negative except as noted above.  Physical Exam    Wt Readings from Last 3 Encounters:  07/31/21 173 lb 9.6 oz (78.7 kg)  10/17/20 168 lb 3.2 oz (76.3 kg)  06/24/16 155 lb (70.3 kg)   QI:ONGEX were Joseph [name] vitals filed for this visit.,There is Joseph [name] height or weight on file to calculate BMI. GEN: Well nourished, well developed in Joseph [name] acute distress Neck: Joseph [name] JVD; Joseph [name] carotid bruits Pulmonary: Clear to auscultation without rales, wheezing or rhonchi  Cardiovascular: Normal rate. Regular rhythm. Normal S1. Normal S2.   Murmurs: There is Joseph [name] murmur.  ABDOMEN: Soft, non-tender, non-distended EXTREMITIES:  Joseph [name] edema; Joseph [name] deformity   EKG/LABS/ Recent Cardiac Studies   ECG personally reviewed by me today -sinus rhythm with rate of 61 bpm and Joseph [name] acute changes consistent with previous EKG.  Risk Assessment/Calculations:          Joseph [name] results found for: "WBC", "HGB", "HCT", "MCV", "PLT" Joseph [name] results found for: "CREATININE", "BUN", "NA", "K", "CL", "CO2" Joseph [name] results found for: "CHOL", "HDL", "LDLCALC", "LDLDIRECT", "TRIG", "CHOLHDL"  Joseph [name] results found for: "HGBA1C" Assessment & Plan  1.  Family history of premature CAD: -Today patient reports ongoing chest discomfort that is waxing and waning and relieved with rest. -Patient will complete coronary CTA to further define coronary arteries and to rule out stenosis -Patient will use as needed Nitrostat 0.4 mg -Patient is aware to go to the ED if discomfort is increased and not relieved with as needed Nitrostat. -May add ASA 81 mg to current regimen based on results of CT scan.  2.   Primary hypertension: -Patient's blood pressure today was 124/80 -Continue losartan 50 mg daily -Patient reports elevated blood pressures and will start amlodipine 2.5 mg for BP control and antianginal relief -Patient will monitor blood pressures over the next 2 weeks and contact office if elevated or labile.  3.  Hyperlipidemia: -LDL was controlled at 42 -Continue Lipitor 40 mg daily  4.  Chest pain: -Patient will complete coronary CTA -Continue as needed Nitrostat -Amlodipine 2.5 mg daily  Disposition: Follow-up with None or APP in 3 months    Signed, Joseph Joseph [name], Joseph Rains, NP 08/30/2023, 2:30 PM [facility]

## 2023-08-31 ENCOUNTER — Ambulatory Visit: Payer: BC Managed Care – PPO | Attending: Nurse Practitioner | Admitting: Nurse Practitioner

## 2023-08-31 ENCOUNTER — Encounter: Payer: Self-pay | Admitting: Nurse Practitioner

## 2023-08-31 VITALS — BP 124/80 | HR 61 | Ht 70.0 in | Wt 166.8 lb

## 2023-08-31 DIAGNOSIS — Z8249 Family history of ischemic heart disease and other diseases of the circulatory system: Secondary | ICD-10-CM | POA: Diagnosis not present

## 2023-08-31 DIAGNOSIS — R072 Precordial pain: Secondary | ICD-10-CM

## 2023-08-31 DIAGNOSIS — I10 Essential (primary) hypertension: Secondary | ICD-10-CM | POA: Diagnosis not present

## 2023-08-31 DIAGNOSIS — E785 Hyperlipidemia, unspecified: Secondary | ICD-10-CM | POA: Diagnosis not present

## 2023-08-31 MED ORDER — NITROGLYCERIN 0.4 MG SL SUBL: 0.4000 mg | SUBLINGUAL_TABLET | SUBLINGUAL | 3 refills | Status: DC | PRN

## 2023-08-31 MED ORDER — AMLODIPINE BESYLATE 2.5 MG PO TABS: 2.5000 mg | ORAL_TABLET | Freq: Every day | ORAL | 1 refills | Status: DC

## 2023-08-31 MED ORDER — NITROGLYCERIN 0.4 MG SL SUBL: 0.4000 mg | SUBLINGUAL_TABLET | SUBLINGUAL | 3 refills | Status: AC | PRN

## 2023-08-31 MED ORDER — AMLODIPINE BESYLATE 2.5 MG PO TABS: 2.5000 mg | ORAL_TABLET | Freq: Every day | ORAL | 0 refills | Status: DC

## 2023-08-31 NOTE — Addendum Note (Signed)
 Addended by: Alveta Heimlich on: 08/31/2023 02:38 PM   Modules accepted: Orders

## 2023-08-31 NOTE — Patient Instructions (Addendum)
 Medication Instructions:  START Norvasc (Amlodipine) 2.5mg  Take 1 tablet once a day  START Nitroglycerin 0.4mg  Take 1 as needed for emergency chest pain. Take first dose for emergency chest pain; WAIT 5 minutes and then take 2nd dose. IF still having pain if still having chest pain CALL 911 wait an additional 5 minutes before taking final dose. Do not take more than 3 doses in a day.  *If you need a refill on your cardiac medications before your next appointment, please call your pharmacy*   Lab Work: None ordered If you have labs (blood work) drawn today and your tests are completely normal, you will receive your results only by: MyChart Message (if you have MyChart) OR A paper copy in the mail If you have any lab test that is abnormal or we need to change your treatment, we will call you to review the results.   Testing/Procedures: Your physician has requested that you have cardiac CT. Cardiac computed tomography (CT) is a painless test that uses an x-ray machine to take clear, detailed pictures of your heart. For further information please visit [email or web address] Please follow instruction sheet as given.   Follow-Up: At [facility], you and your health needs are our priority.  As part of our continuing mission to provide you with exceptional heart care, we have created designated Provider Care Teams.  These Care Teams include your primary Cardiologist (physician) and Advanced Practice Providers (APPs -  Physician Assistants and Nurse Practitioners) who all work together to provide you with the care you need, when you need it.  We recommend signing up for the patient portal called "MyChart".  Sign up information is provided on this After Visit Summary.  MyChart is used to connect with patients for Virtual Visits (Telemedicine).  Patients are able to view lab/test results, encounter notes, upcoming appointments, etc.  Non-urgent messages can be sent to your provider as well.   To  learn more about what you can do with MyChart, go to [email or web address].    Your next appointment:   3 month(s)  Provider:   Robin Searing, NP       Other Instructions  Check your blood pressure daily for 2 weeks, then contact the office with your readings.  Contact the office either by phone or MyChart with your readings.  Make sure to check your blood pressure 2 hours after taking your medications.   AVOID these things for 30 minutes before checking your blood pressure: No Drinking caffeine. No Drinking alcohol. No Eating. No Smoking. No Exercising.  Five minutes before checking your blood pressure: Pee. Sit in a dining chair. Avoid sitting in a soft couch or armchair. Be quiet. Do not talk.     Your cardiac CT will be scheduled at one of the below locations:   [facility] [address] [place], [place] [address] [phone]  OR  [facility] [address] [place], [place] [address] [phone]  OR   [facility] [address] [place], [place] [address] [phone]  OR   [facility] [address] [place], [place] [address] [phone]  If scheduled at [facility], please arrive at the [facility] and Children's Entrance (Entrance C2) of [facility] 30 minutes prior to test start time. You can use the FREE valet parking offered at entrance C (encouraged to control the heart rate for the test)  Proceed to the [facility] Radiology  Department (first floor) to check-in and test prep.  All radiology patients and guests should use entrance C2 at [facility], accessed from [facility], even though the hospital's physical address listed is [address].    If scheduled at [facility] or [facility], please arrive 15 mins early for check-in and test prep.  There is  spacious parking and easy access to the radiology department from the [facility] entrance. Please enter here and check-in with the desk attendant.   If scheduled at [facility], please arrive 30 minutes early for check-in and test prep.  Please follow these instructions carefully (unless otherwise directed):  An IV will be required for this test and Nitroglycerin will be given.  Hold all erectile dysfunction medications at least 3 days (72 hrs) prior to test. (Ie viagra, cialis, sildenafil, tadalafil, etc)   On the Night Before the Test: Be sure to Drink plenty of water. Do not consume any caffeinated/decaffeinated beverages or chocolate 12 hours prior to your test. Do not take any antihistamines 12 hours prior to your test. If the patient has contrast allergy: Patient will need a prescription for Prednisone and very clear instructions (as follows): Prednisone 50 mg - take 13 hours prior to test Take another Prednisone 50 mg 7 hours prior to test Take another Prednisone 50 mg 1 hour prior to test Take Benadryl 50 mg 1 hour prior to test Patient must complete all four doses of above prophylactic medications. Patient will need a ride after test due to Benadryl.  On the Day of the Test: Drink plenty of water until 1 hour prior to the test. Do not eat any food 1 hour prior to test. You may take your regular medications prior to the test.  Take metoprolol (Lopressor) two hours prior to test. If you take Furosemide/Hydrochlorothiazide/Spironolactone/Chlorthalidone, please HOLD on the morning of the test. Patients who wear a continuous glucose monitor MUST remove the device prior to scanning. FEMALES- please wear underwire-free bra if available, avoid dresses & tight clothing  *For Clinical Staff only. Please instruct patient the following:* Heart Rate Medication Recommendations for Cardiac CT  Resting HR < 50 bpm  No medication  Resting HR 50-60 bpm and BP >110/50  mmHG   Consider Metoprolol tartrate 25 mg PO 90-120 min prior to scan  Resting HR 60-65 bpm and BP >110/50 mmHG  Metoprolol tartrate 50 mg PO 90-120 minutes prior to scan   Resting HR > 65 bpm and BP >110/50 mmHG  Metoprolol tartrate 100 mg PO 90-120 minutes prior to scan  Consider Ivabradine 10-15 mg PO or a calcium channel blocker for resting HR >60 bpm and contraindication to metoprolol tartrate  Consider Ivabradine 10-15 mg PO in combination with metoprolol tartrate for HR >80 bpm        After the Test: Drink plenty of water. After receiving IV contrast, you may experience a mild flushed feeling. This is normal. On occasion, you may experience a mild rash up to 24 hours after the test. This is not dangerous. If this occurs, you can take Benadryl 25 mg, Zyrtec, Claritin, or Allegra and increase your fluid intake. (Patients taking Tikosyn should avoid Benadryl, and may take Zyrtec, Claritin, or Allegra) If you experience trouble breathing, this can be serious. If it is severe call [phone] IMMEDIATELY. If it is mild, please call our office.  We will call to schedule your test 2-4 weeks out understanding that  some insurance companies will need an authorization prior to the service being performed.   For more information and frequently asked questions, please visit our website : [email or web address]  For non-scheduling related questions, please contact the cardiac imaging nurse navigator should you have any questions/concerns: Cardiac Imaging Nurse Navigators Direct Office Dial: [phone]   For scheduling needs, including cancellations and rescheduling, please call [place], [phone].

## 2023-09-07 ENCOUNTER — Encounter (HOSPITAL_COMMUNITY): Payer: Self-pay

## 2023-09-09 ENCOUNTER — Telehealth (HOSPITAL_COMMUNITY): Payer: Self-pay | Admitting: *Deleted

## 2023-09-09 NOTE — Telephone Encounter (Signed)
 Reaching out to patient to offer assistance regarding upcoming cardiac imaging study; pt verbalizes understanding of appt date/time, parking situation and where to check in, pre-test NPO status and medications ordered, and verified current allergies; name and call back number provided for further questions should they arise Johney Frame RN Navigator Cardiac Imaging [facility] [phone] office [phone] cell

## 2023-09-10 ENCOUNTER — Ambulatory Visit (HOSPITAL_COMMUNITY)
Admission: RE | Admit: 2023-09-10 | Discharge: 2023-09-10 | Disposition: A | Discharge status: Home / Self Care | Source: Ambulatory Visit | Attending: Nurse Practitioner | Admitting: Nurse Practitioner

## 2023-09-10 DIAGNOSIS — R072 Precordial pain: Secondary | ICD-10-CM | POA: Insufficient documentation

## 2023-09-10 DIAGNOSIS — I10 Essential (primary) hypertension: Secondary | ICD-10-CM | POA: Insufficient documentation

## 2023-09-10 DIAGNOSIS — E785 Hyperlipidemia, unspecified: Secondary | ICD-10-CM | POA: Diagnosis present

## 2023-09-10 DIAGNOSIS — Z8249 Family history of ischemic heart disease and other diseases of the circulatory system: Secondary | ICD-10-CM | POA: Diagnosis present

## 2023-09-10 MED ORDER — NITROGLYCERIN 0.4 MG SL SUBL
SUBLINGUAL_TABLET | SUBLINGUAL | Status: AC
  Filled 2023-09-10: qty 2

## 2023-09-10 MED ORDER — DILTIAZEM HCL 25 MG/5ML IV SOLN: 10.0000 mg | INTRAVENOUS | Status: DC | PRN

## 2023-09-10 MED ORDER — NITROGLYCERIN 0.4 MG SL SUBL
0.8000 mg | SUBLINGUAL_TABLET | Freq: Once | SUBLINGUAL | Status: AC
  Administered 2023-09-10: 0.8 mg via SUBLINGUAL

## 2023-09-10 MED ORDER — METOPROLOL TARTRATE 5 MG/5ML IV SOLN: 10.0000 mg | Freq: Once | INTRAVENOUS | Status: DC | PRN

## 2023-09-10 MED ORDER — IOHEXOL 350 MG/ML SOLN
95.0000 mL | Freq: Once | INTRAVENOUS | Status: AC | PRN
  Administered 2023-09-10: 95 mL via INTRAVENOUS

## 2023-09-11 ENCOUNTER — Other Ambulatory Visit: Payer: Self-pay

## 2023-09-11 MED ORDER — ASPIRIN 81 MG PO TBEC: 81.0000 mg | DELAYED_RELEASE_TABLET | Freq: Every day | ORAL | Status: AC

## 2023-12-14 NOTE — Progress Notes (Signed)
 " Cardiology Office Note    Patient Name: Joseph [name] Date of Encounter: 12/14/2023  Primary Care Provider:  Clarice Nottingham, MD Primary Cardiologist:  None Primary Electrophysiologist: None   Past Medical History    Past Medical History:  Diagnosis Date   Hyperlipidemia    Hypertension     History of Present Illness  Joseph [name] is a 58 y.o. male with a PMH of premature CAD, HTN, HLD presents today for 53-month follow-up.  Joseph [name] was last seen on 08/31/2023 for posthospital follow-up.  That he reported ongoing waxing and waning chest pain.  He completed a coronary CTA that showed normal coronaries with nonobstructive disease.  He was advised to continue as needed Nitrostat  and also continued amlodipine  2.5 mg daily.  Joseph [name] presents today for 17-month follow-up. He continues to experience chest discomfort, described as tightness in the upper chest, particularly after alcohol consumption.  He denies any discomfort similar to his prior chest discomfort with no shortness of breath or acute symptoms, just slight discomfort. He has been on nitroglycerin  and amlodipine  2.5 mg which he continues to take without side effects. He has been monitoring his heart rate, which has shown occasional drops to the 40s, particularly in the early morning. He is aware of these fluctuations through his watch. He has a history of being physically active, having run regularly until calf issues limited his running. He has resumed running and gym workouts since March, which he believes has contributed to his low heart rate. No dizziness or issues since starting amlodipine . He is on aspirin , Plavix, and Lipitor as part of his preventative therapy. No problems with these medications, including no stomach issues since starting aspirin . He has a history of alcohol consumption, which affects his heart rate and pulse, particularly after nights of drinking. He is trying to change this habit. No gastrointestinal  symptoms such as stool changes, nausea, or vomiting, but he does experience discomfort that he associates with dehydration and muscular issues after drinking. 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 He has not utilized [employer] since his service. Patient denies chest pain, palpitations, dyspnea, PND, orthopnea, nausea, vomiting, dizziness, syncope, edema, weight gain, or early satiety.  Discussed the use of AI scribe software for clinical note transcription with the patient, who gave verbal consent to proceed.  History of Present Illness   Review of Systems  Please see the history of present illness.    All other systems reviewed and are otherwise negative except as noted above.  Physical Exam    Wt Readings from Last 3 Encounters:  08/31/23 166 lb 12.8 oz (75.7 kg)  07/31/21 173 lb 9.6 oz (78.7 kg)  10/17/20 168 lb 3.2 oz (76.3 kg)   CD:Uyzmz were no vitals filed for this visit.,There is no height or weight on file to calculate BMI. GEN: Well nourished, well developed in no acute distress Neck: No JVD; No carotid bruits Pulmonary: Clear to auscultation without rales, wheezing or rhonchi  Cardiovascular: Normal rate. Regular rhythm. Normal S1. Normal S2.   Murmurs: There is no murmur.  ABDOMEN: Soft, non-tender, non-distended EXTREMITIES:  No edema; No deformity   EKG/LABS/ Recent Cardiac Studies   ECG personally reviewed by me today -none completed today  Risk Assessment/Calculations:          No results found for: WBC, HGB, HCT, MCV, PLT No results found for: CREATININE, BUN, NA, K, CL,  CO2 No results found for: CHOL, HDL, LDLCALC, LDLDIRECT, TRIG, CHOLHDL  No results found for: HGBA1C Assessment & Plan    Assessment & Plan  1.Family history of premature CAD: -Today patient reports no recurrent chest discomfort similar to prior. Intermittent chest  pain with no significant obstructive coronary artery disease. Possible microvascular cause. Occasional upper chest tightness possibly related to alcohol and dehydration. Differential includes GI-related discomfort, possibly reflux. CT scan showed noncalcified plaque with minimal obstructive disease. - Continue amlodipine  for anti-anginal relief and microvascular support. - Continue aspirin  81 mg and Lipitor 40 mg daily  2.  Primary HTN: - Patient's blood pressure today is stable at 128/72 - Continue Norvasc  2.5 mg and losartan 50 mg daily  3.  Chest pain: ntermittent chest pain with no significant obstructive coronary artery disease. Possible microvascular cause. Occasional upper chest tightness possibly related to alcohol and dehydration. Differential includes GI-related discomfort, possibly reflux.  - He was advised to continue amlodipine  2.5 mg and try Tums or over-the-counter PPI. -Consult gastroenterologist if GI symptoms persist.  4.  Hyperlipidemia: Patient's last LDL cholesterol was 77 on 8/78/7974 - Continue Lipitor 40 mg daily  5.  Sinus bradycardia: Bradycardia likely due to well-conditioned state from regular exercise. No symptoms of dizziness or hypotension. EKG shows no heart block. Heart rate drops during deep sleep, normal for well-conditioned individuals. - No immediate intervention required as long as he remains asymptomati  Disposition: Follow-up with None or APP in 6 months    Signed, Wyn Raddle, Jackee Shove, NP 12/14/2023, 8:09 AM [facility] "

## 2023-12-15 ENCOUNTER — Encounter: Payer: Self-pay | Admitting: Nurse Practitioner

## 2023-12-15 ENCOUNTER — Ambulatory Visit: Payer: Self-pay | Attending: Nurse Practitioner | Admitting: Nurse Practitioner

## 2023-12-15 VITALS — BP 128/72 | HR 53 | Ht 70.0 in | Wt 167.6 lb

## 2023-12-15 DIAGNOSIS — E785 Hyperlipidemia, unspecified: Secondary | ICD-10-CM

## 2023-12-15 DIAGNOSIS — I10 Essential (primary) hypertension: Secondary | ICD-10-CM

## 2023-12-15 DIAGNOSIS — Z8249 Family history of ischemic heart disease and other diseases of the circulatory system: Secondary | ICD-10-CM

## 2023-12-15 DIAGNOSIS — R001 Bradycardia, unspecified: Secondary | ICD-10-CM

## 2023-12-15 DIAGNOSIS — R072 Precordial pain: Secondary | ICD-10-CM

## 2023-12-15 MED ORDER — AMLODIPINE BESYLATE 2.5 MG PO TABS: 2.5000 mg | ORAL_TABLET | Freq: Every day | ORAL | 3 refills | Status: DC

## 2023-12-15 NOTE — Patient Instructions (Signed)
 Medication Instructions:  Your physician recommends that you continue on your current medications as directed. Please refer to the Current Medication list given to you today. *If you need a refill on your cardiac medications before your next appointment, please call your pharmacy*  Lab Work: None ordered If you have labs (blood work) drawn today and your tests are completely normal, you will receive your results only by: MyChart Message (if you have MyChart) OR A paper copy in the mail If you have any lab test that is abnormal or we need to change your treatment, we will call you to review the results.  Testing/Procedures: None ordered  Follow-Up: At [facility], you and your health needs are our priority.  As part of our continuing mission to provide you with exceptional heart care, our providers are all part of one team.  This team includes your primary Cardiologist (physician) and Advanced Practice Providers or APPs (Physician Assistants and Nurse Practitioners) who all work together to provide you with the care you need, when you need it.  Your next appointment:   12 month(s)  Provider:   Charles Connor, NP   We recommend signing up for the patient portal called "MyChart".  Sign up information is provided on this After Visit Summary.  MyChart is used to connect with patients for Virtual Visits (Telemedicine).  Patients are able to view lab/test results, encounter notes, upcoming appointments, etc.  Non-urgent messages can be sent to your provider as well.   To learn more about what you can do with MyChart, go to [email or web address].   Other Instructions

## 2024-06-30 ENCOUNTER — Other Ambulatory Visit: Payer: Self-pay

## 2024-06-30 MED ORDER — AMLODIPINE BESYLATE 2.5 MG PO TABS: 2.5000 mg | ORAL_TABLET | Freq: Every day | ORAL | 3 refills | Status: AC
